# Patient Record
Sex: Female | Born: 1955 | Race: White | Hispanic: No | Smoking: Never smoker
Health system: Southern US, Community
[De-identification: ages and names within clinical notes are randomized; demographics above are authoritative.]

## PROBLEM LIST (undated history)

## (undated) DIAGNOSIS — R51 Headache: Secondary | ICD-10-CM

## (undated) DIAGNOSIS — K5792 Diverticulitis of intestine, part unspecified, without perforation or abscess without bleeding: Secondary | ICD-10-CM

## (undated) DIAGNOSIS — Z8619 Personal history of other infectious and parasitic diseases: Secondary | ICD-10-CM

## (undated) DIAGNOSIS — F329 Major depressive disorder, single episode, unspecified: Secondary | ICD-10-CM

## (undated) DIAGNOSIS — F32A Depression, unspecified: Secondary | ICD-10-CM

## (undated) DIAGNOSIS — E039 Hypothyroidism, unspecified: Secondary | ICD-10-CM

## (undated) DIAGNOSIS — J302 Other seasonal allergic rhinitis: Secondary | ICD-10-CM

## (undated) DIAGNOSIS — Z8669 Personal history of other diseases of the nervous system and sense organs: Secondary | ICD-10-CM

## (undated) DIAGNOSIS — R519 Headache, unspecified: Secondary | ICD-10-CM

## (undated) DIAGNOSIS — M1990 Unspecified osteoarthritis, unspecified site: Secondary | ICD-10-CM

## (undated) DIAGNOSIS — D649 Anemia, unspecified: Secondary | ICD-10-CM

## (undated) HISTORY — DX: Diverticulitis of intestine, part unspecified, without perforation or abscess without bleeding: K57.92

## (undated) HISTORY — DX: Headache: R51

## (undated) HISTORY — PX: KNEE ARTHROSCOPY: SHX127

## (undated) HISTORY — DX: Anemia, unspecified: D64.9

## (undated) HISTORY — DX: Major depressive disorder, single episode, unspecified: F32.9

## (undated) HISTORY — DX: Headache, unspecified: R51.9

## (undated) HISTORY — DX: Personal history of other infectious and parasitic diseases: Z86.19

## (undated) HISTORY — DX: Personal history of other diseases of the nervous system and sense organs: Z86.69

## (undated) HISTORY — DX: Other seasonal allergic rhinitis: J30.2

## (undated) HISTORY — DX: Hypothyroidism, unspecified: E03.9

## (undated) HISTORY — DX: Unspecified osteoarthritis, unspecified site: M19.90

## (undated) HISTORY — DX: Depression, unspecified: F32.A

---

## 2007-03-05 HISTORY — PX: SHOULDER ARTHROSCOPY: SHX128

## 2010-02-23 ENCOUNTER — Other Ambulatory Visit
Admission: RE | Admit: 2010-02-23 | Discharge: 2010-02-23 | Payer: Self-pay | Source: Home / Self Care | Admitting: Family Medicine

## 2010-03-02 ENCOUNTER — Ambulatory Visit (HOSPITAL_COMMUNITY)
Admission: RE | Admit: 2010-03-02 | Discharge: 2010-03-02 | Payer: Self-pay | Source: Home / Self Care | Attending: Family Medicine | Admitting: Family Medicine

## 2010-06-04 ENCOUNTER — Other Ambulatory Visit: Payer: Self-pay | Admitting: Rheumatology

## 2010-06-06 ENCOUNTER — Other Ambulatory Visit: Payer: Self-pay | Admitting: Rheumatology

## 2010-06-06 DIAGNOSIS — M25559 Pain in unspecified hip: Secondary | ICD-10-CM

## 2010-06-08 ENCOUNTER — Ambulatory Visit
Admission: RE | Admit: 2010-06-08 | Discharge: 2010-06-08 | Disposition: A | Discharge status: Home / Self Care | Payer: BC Managed Care – PPO | Source: Ambulatory Visit | Attending: Rheumatology | Admitting: Rheumatology

## 2010-06-08 DIAGNOSIS — M25559 Pain in unspecified hip: Secondary | ICD-10-CM

## 2014-08-22 ENCOUNTER — Ambulatory Visit (INDEPENDENT_AMBULATORY_CARE_PROVIDER_SITE_OTHER): Payer: Managed Care, Other (non HMO)

## 2014-08-22 ENCOUNTER — Ambulatory Visit (INDEPENDENT_AMBULATORY_CARE_PROVIDER_SITE_OTHER): Payer: Managed Care, Other (non HMO) | Admitting: Family Medicine

## 2014-08-22 VITALS — BP 135/76 | HR 76 | Temp 99.2°F | Resp 16 | Ht 62.0 in | Wt 149.0 lb

## 2014-08-22 DIAGNOSIS — R1030 Lower abdominal pain, unspecified: Secondary | ICD-10-CM | POA: Diagnosis not present

## 2014-08-22 LAB — POCT CBC
GRANULOCYTE PERCENT: 78.2 % (ref 37–80)
HEMATOCRIT: 39.3 % (ref 37.7–47.9)
Hemoglobin: 12.8 g/dL (ref 12.2–16.2)
Lymph, poc: 1.7 (ref 0.6–3.4)
MCH: 28.5 pg (ref 27–31.2)
MCHC: 32.6 g/dL (ref 31.8–35.4)
MCV: 87.5 fL (ref 80–97)
MID (CBC): 0.7 (ref 0–0.9)
MPV: 7 fL (ref 0–99.8)
POC GRANULOCYTE: 8.7 — AB (ref 2–6.9)
POC LYMPH %: 15.4 % (ref 10–50)
POC MID %: 6.4 %M (ref 0–12)
Platelet Count, POC: 248 10*3/uL (ref 142–424)
RBC: 4.49 M/uL (ref 4.04–5.48)
RDW, POC: 13.2 %
WBC: 11.1 10*3/uL — AB (ref 4.6–10.2)

## 2014-08-22 LAB — POCT URINALYSIS DIPSTICK
BILIRUBIN UA: NEGATIVE
GLUCOSE UA: NEGATIVE
NITRITE UA: NEGATIVE
PH UA: 6
Protein, UA: NEGATIVE
Spec Grav, UA: 1.015
Urobilinogen, UA: 1

## 2014-08-22 LAB — POCT UA - MICROSCOPIC ONLY
CASTS, UR, LPF, POC: NEGATIVE
CRYSTALS, UR, HPF, POC: NEGATIVE
MUCUS UA: NEGATIVE
YEAST UA: NEGATIVE

## 2014-08-22 NOTE — Patient Instructions (Signed)
Drink plenty of fluids  Take some MiraLAX one dose daily for a couple of days until your bowels seem to have gotten cleared out well.  Continue your medications, ciprofloxacin and metronidazole, that you were given a few days ago.  If the pain gets acutely worse at any time return or go to the emergency room. If you're running higher fever, passing any blood, or any other major symptoms please return.

## 2014-08-22 NOTE — Progress Notes (Signed)
Subjective:  Patient ID: Michelle Parsons, female    DOB: [birth year]  Age: 59 y.o. MRN: [id number]  12-year-old lady who is here for her first time. She moved here a couple of months ago from [place]. On Thursday she was at work and developed low abdominal pain. It got Parsons and she went home. She was having fever and chills and temperature of 101.7. Friday morning the discomfort and pain persisted but she is not is febrile. She went to a different urgent care and told the physician that she had had previous diverticulitis. No labs were done and patient was treated with ciprofloxacin and metronidazole. She has not had the high fevers through the weekend, but has continued to have the pain in her low abdomen. It is seem to be worse in the morning when she gets up and first goes to the bathroom. It has been more steady all day today. She realizes in retrospect that she did have some bloating and discomfort earlier in the week last week. She has not had any surgeries on her abdomen ever and has not had any STDs. She is menopausal. She did have a CT scan in the past when they made the initial diagnosis of diverticulitis.  Other medical problems in her past medical history include arthritis, depression, thyroid disease. She has had arthroscopies but no other surgeries. She is gravida 0 para 0.  Family history is essentially unremarkable.  Social history she does not smoke, drinks about 1 drink per week, and does not use drugs.  Generally she is healthy. No headaches or dizziness. No shortness of breath or cough. No cardiovascular issues. No diarrhea. Bowel movements are normal. She is due a colonoscopy, having been about 10 years since her previous one which was normal. GU is otherwise unremarkable. Dermatologic unremarkable. Musculoskeletal has had shoulder and knee surgeries with arthroscopy. Objective:   Healthy-appearing lady in no major acute distress. Chest clear. Heart regular without murmurs.  Abdomen has normal active bowel sounds. Soft without organomegaly or masses. She is tender diffusely across the lower abdomen from the left lower quadrant all the way across to the right lower quadrant. She has mild tenderness in the upper upper abdomen. No rebound. Skin normal. Afebrile.  Assessment & Plan:   Assessment: Low abdominal pain History of diverticulitis Currently being treated for presumed diverticulitis  Plan: CBC, urinalysis, abdominal series Results for orders placed or performed in visit on 08/22/14  POCT CBC  Result Value Ref Range   WBC 11.1 (A) 4.6 - 10.2 K/uL   Lymph, poc 1.7 0.6 - 3.4   POC LYMPH PERCENT 15.4 10 - 50 %L   MID (cbc) 0.7 0 - 0.9   POC MID % 6.4 0 - 12 %M   POC Granulocyte 8.7 (A) 2 - 6.9   Granulocyte percent 78.2 37 - 80 %G   RBC 4.49 4.04 - 5.48 M/uL   Hemoglobin 12.8 12.2 - 16.2 g/dL   HCT, POC 78.2 95.6 - 47.9 %   MCV 87.5 80 - 97 fL   MCH, POC 28.5 27 - 31.2 pg   MCHC 32.6 31.8 - 35.4 g/dL   RDW, POC 21.3 %   Platelet Count, POC 248 142 - 424 K/uL   MPV 7.0 0 - 99.8 fL  POCT UA - Microscopic Only  Result Value Ref Range   WBC, Ur, HPF, POC 1-4    RBC, urine, microscopic 5-8    Bacteria, U Microscopic trace    Mucus, UA  neg    Epithelial cells, urine per micros 1-2    Crystals, Ur, HPF, POC neg    Casts, Ur, LPF, POC neg    Yeast, UA neg   POCT urinalysis dipstick  Result Value Ref Range   Color, UA yellow    Clarity, UA clear    Glucose, UA neg    Bilirubin, UA neg    Ketones, UA trace    Spec Grav, UA 1.015    Blood, UA mod    pH, UA 6.0    Protein, UA neg    Urobilinogen, UA 1.0    Nitrite, UA neg    Leukocytes, UA Trace (A) Negative   [facility] reading (PRIMARY) by  Dr. Alwyn Ren Moderate amount of gas in the colon, no air-fluid levels, free air, no apparent calculi. Chest is normal. This appears to be a nonspecific abdominal series..   There are no Patient Instructions on file for this visit.   HOPPER,DAVID, MD  08/22/2014

## 2014-11-18 ENCOUNTER — Encounter: Payer: Self-pay | Admitting: Gastroenterology

## 2014-12-09 ENCOUNTER — Ambulatory Visit (AMBULATORY_SURGERY_CENTER): Payer: Managed Care, Other (non HMO) | Admitting: *Deleted

## 2014-12-09 ENCOUNTER — Telehealth: Payer: Self-pay | Admitting: *Deleted

## 2014-12-09 VITALS — Ht 62.0 in | Wt 151.0 lb

## 2014-12-09 DIAGNOSIS — Z1211 Encounter for screening for malignant neoplasm of colon: Secondary | ICD-10-CM

## 2014-12-09 NOTE — Progress Notes (Signed)
No egg or soy allergy No diet pill No home 02  No issues with past sedation emmi video declined Pt had a colon 10 + years ago in [place] which was normal. She had diverticulitis august 2006 and this year has had two episodes 05-2014 and again 08-2014. She now 12-2014 this week ate salad and has had lower abd pain but no fever so far this episode.  She has tried to tweek her diet but continues to get these painful abdominal episodes with fever. Her colon is scheduled for 10-21 Friday at 200 pm.  Pt concerned about re occurance. Her sister has had colon removed due to the diverticulitis. TE to Armbruster asking if he wants OV.  Records release signed and to Armbruster's CMA Amber  to get old records from [facility] . Last colon 2006 per pt, was normal per pt.

## 2014-12-09 NOTE — Telephone Encounter (Signed)
Pt had a colon 10 + years ago in [place] which was normal. She had diverticulitis august 2006 and this year has had two episodes 05-2014 and again 08-2014. She now 12-2014 this week ate salad and has had lower abd pain but no fever so far this episode.  She has tried to tweek her diet but continues to get these painful abdominal episodes with fever. Her colon is scheduled for 10-21 Friday at 200 pm.  Pt concerned about re occurance. Her sister has had colon removed due to the diverticulitis  Do you want her to have an OV prior to this colon or just discuss with her post colonoscopy on the 21st?  Please advise  Thanks, Hilda Lias PV

## 2014-12-09 NOTE — Telephone Encounter (Signed)
If she is having fevers or active pain consistent with her prior diverticulitis symptoms then we should hold off on colonoscopy and allow her to get through the episode, and see her in clinic to get her treated. If symptoms have resolved and she is feeling well then we can proceed with the procedure as scheduled.

## 2014-12-12 NOTE — Telephone Encounter (Signed)
Called pt concerning symptoms noted during PV. Pt said she has not had any pain since Friday, doesn't have a fever, and would like to proceed with the procedure on 12-23-14. Advised per Dr. Lanetta Inch note, okay to proceed. Also advised pt to call us if symptoms reoccur.

## 2014-12-23 ENCOUNTER — Ambulatory Visit (AMBULATORY_SURGERY_CENTER): Payer: Managed Care, Other (non HMO) | Admitting: Gastroenterology

## 2014-12-23 ENCOUNTER — Encounter: Payer: Self-pay | Admitting: Gastroenterology

## 2014-12-23 ENCOUNTER — Telehealth: Payer: Self-pay | Admitting: Gastroenterology

## 2014-12-23 VITALS — BP 111/67 | HR 60 | Temp 98.5°F | Resp 14 | Ht 62.0 in | Wt 151.0 lb

## 2014-12-23 DIAGNOSIS — Z1211 Encounter for screening for malignant neoplasm of colon: Secondary | ICD-10-CM | POA: Diagnosis not present

## 2014-12-23 MED ORDER — SODIUM CHLORIDE 0.9 % IV SOLN: 500.0000 mL | INTRAVENOUS | Status: DC

## 2014-12-23 NOTE — Telephone Encounter (Signed)
Spoke with patient and she stated that she couldn't drink the prep last night without throwing it up. She is having stools, so I told her to start the prep now.  Take a little longer sipping it and to use a straw.  She will also sip on coke in the meantime to settle her stomach.  Patient will call us if she has any more problems.

## 2014-12-23 NOTE — Op Note (Signed)
[facility] [address] [place], [id number]   COLONOSCOPY PROCEDURE REPORT  PATIENT: Michelle Parsons, Michelle Parsons  MR#: [id number] BIRTHDATE: [birth year] , 59  yrs. old GENDER: female ENDOSCOPIST: Benancio DeedsSteven P Armbruster, MD REFERRED BY: Hillard DankerElizabeth Crawford, MD PROCEDURE DATE:  12/23/2014 PROCEDURE:   Colonoscopy, screening First Screening Colonoscopy - Avg.  risk and is 50 yrs.  old or older - No.  Prior Negative Screening - Now for repeat screening. 10 or more years since last screening  History of Adenoma - Now for follow-up colonoscopy & has been > or = to 3 yrs.  N/A  Polyps removed today? No Recommend repeat exam, <10 yrs? No ASA CLASS:   Class II INDICATIONS:Screening for colonic neoplasia and Colorectal Neoplasm Risk Assessment for this procedure is average risk. MEDICATIONS: Propofol 300 mg IV  DESCRIPTION OF PROCEDURE:   After the risks benefits and alternatives of the procedure were thoroughly explained, informed consent was obtained.  The digital rectal exam revealed no abnormalities of the rectum.   The LB [id number]-[id number]  endoscope was introduced through the anus and advanced to the cecum, which was identified by both the appendix and ileocecal valve. No adverse events experienced.   The quality of the prep was good.  The instrument was then slowly withdrawn as the colon was fully examined. Estimated blood loss is zero unless otherwise noted in this procedure report.   COLON FINDINGS: There was moderate diverticulosis noted throughout the entire examined colon, heaviest burden in the right colon. The examination was otherwise normal. No polyps or mass lesions appreciated.  Retroflexed views revealed no abnormalities. The time to cecum = 4.8 Withdrawal time = 16.8   The scope was withdrawn and the procedure completed. COMPLICATIONS: There were no immediate complications.  ENDOSCOPIC IMPRESSION: 1.   Moderate diverticulosis was noted throughout the  entire examined colon 2.   The examination was otherwise normal  RECOMMENDATIONS: Repeat colonoscopy screening in 10 years Resume diet Resume medications High fiber diet for diverticulosis.  Consider surgical evaluation if continued recurrence of diverticulitis  eSigned:  Benancio DeedsSteven P Armbruster, MD 12/23/2014 2:33 PM   cc: Hillard DankerElizabeth Crawford MD, the patient

## 2014-12-23 NOTE — Progress Notes (Signed)
To recovery, [place] to [place], Charity fundraiser[place], VSS

## 2014-12-23 NOTE — Patient Instructions (Signed)
YOU HAD AN ENDOSCOPIC PROCEDURE TODAY AT THE [facility]:   Refer to the procedure report that was given to you for any specific questions about what was found during the examination.  If the procedure report does not answer your questions, please call your gastroenterologist to clarify.  If you requested that your care partner not be given the details of your procedure findings, then the procedure report has been included in a sealed envelope for you to review at your convenience later.  YOU SHOULD EXPECT: Some feelings of bloating in the abdomen. Passage of more gas than usual.  Walking can help get rid of the air that was put into your GI tract during the procedure and reduce the bloating. If you had a lower endoscopy (such as a colonoscopy or flexible sigmoidoscopy) you may notice spotting of blood in your stool or on the toilet paper. If you underwent a bowel prep for your procedure, you may not have a normal bowel movement for a few days.  Please Note:  You might notice some irritation and congestion in your nose or some drainage.  This is from the oxygen used during your procedure.  There is no need for concern and it should clear up in a day or so.  SYMPTOMS TO REPORT IMMEDIATELY:   Following lower endoscopy (colonoscopy or flexible sigmoidoscopy):  Excessive amounts of blood in the stool  Significant tenderness or worsening of abdominal pains  Swelling of the abdomen that is new, acute  Fever of 100F or higher  For urgent or emergent issues, a gastroenterologist can be reached at any hour by calling [phone].   DIET: Your first meal following the procedure should be a small meal and then it is ok to progress to your normal diet. Heavy or fried foods are harder to digest and may make you feel nauseous or bloated.  Likewise, meals heavy in dairy and vegetables can increase bloating.  Drink plenty of fluids but you should avoid alcoholic beverages for 24  hours.  ACTIVITY:  You should plan to take it easy for the rest of today and you should NOT DRIVE or use heavy machinery until tomorrow (because of the sedation medicines used during the test).    FOLLOW UP: Our staff will call the number listed on your records the next business day following your procedure to check on you and address any questions or concerns that you may have regarding the information given to you following your procedure. If we do not reach you, we will leave a message.  However, if you are feeling well and you are not experiencing any problems, there is no need to return our call.  We will assume that you have returned to your regular daily activities without incident.  If any biopsies were taken you will be contacted by phone or by letter within the next 1-3 weeks.  Please call us at [phone] if you have not heard about the biopsies in 3 weeks.    SIGNATURES/CONFIDENTIALITY: You and/or your care partner have signed paperwork which will be entered into your electronic medical record.  These signatures attest to the fact that that the information above on your After Visit Summary has been reviewed and is understood.  Full responsibility of the confidentiality of this discharge information lies with you and/or your care-partner.  Diverticulosis, high fiber diet-handouts given  Repeat colonoscopy in 10 years 2026.  

## 2014-12-26 ENCOUNTER — Telehealth: Payer: Self-pay

## 2014-12-26 NOTE — Telephone Encounter (Signed)
No answer, left voicemail message.

## 2015-01-12 ENCOUNTER — Ambulatory Visit (INDEPENDENT_AMBULATORY_CARE_PROVIDER_SITE_OTHER): Payer: Managed Care, Other (non HMO) | Admitting: Internal Medicine

## 2015-01-12 ENCOUNTER — Other Ambulatory Visit (INDEPENDENT_AMBULATORY_CARE_PROVIDER_SITE_OTHER): Payer: Managed Care, Other (non HMO)

## 2015-01-12 ENCOUNTER — Encounter: Payer: Self-pay | Admitting: Internal Medicine

## 2015-01-12 VITALS — BP 122/64 | HR 81 | Temp 98.2°F | Resp 14 | Ht 62.0 in | Wt 153.8 lb

## 2015-01-12 DIAGNOSIS — E039 Hypothyroidism, unspecified: Secondary | ICD-10-CM

## 2015-01-12 DIAGNOSIS — Z0000 Encounter for general adult medical examination without abnormal findings: Secondary | ICD-10-CM | POA: Diagnosis not present

## 2015-01-12 DIAGNOSIS — K5732 Diverticulitis of large intestine without perforation or abscess without bleeding: Secondary | ICD-10-CM

## 2015-01-12 DIAGNOSIS — R102 Pelvic and perineal pain: Secondary | ICD-10-CM

## 2015-01-12 LAB — LIPID PANEL
CHOLESTEROL: 259 mg/dL — AB (ref 0–200)
HDL: 59 mg/dL (ref >39.00)
LDL CALC: 169 mg/dL — AB (ref 0–99)
NONHDL: 200.24
Total CHOL/HDL Ratio: 4
Triglycerides: 156 mg/dL — ABNORMAL HIGH (ref 0.0–149.0)
VLDL: 31.2 mg/dL (ref 0.0–40.0)

## 2015-01-12 LAB — CBC
HEMATOCRIT: 41.7 % (ref 36.0–46.0)
Hemoglobin: 14 g/dL (ref 12.0–15.0)
MCHC: 33.6 g/dL (ref 30.0–36.0)
MCV: 87.3 fl (ref 78.0–100.0)
Platelets: 219 10*3/uL (ref 150.0–400.0)
RBC: 4.78 Mil/uL (ref 3.87–5.11)
RDW: 13 % (ref 11.5–15.5)
WBC: 12.3 10*3/uL — AB (ref 4.0–10.5)

## 2015-01-12 LAB — COMPREHENSIVE METABOLIC PANEL
ALBUMIN: 4.4 g/dL (ref 3.5–5.2)
ALT: 15 U/L (ref 0–35)
AST: 17 U/L (ref 0–37)
Alkaline Phosphatase: 76 U/L (ref 39–117)
BUN: 16 mg/dL (ref 6–23)
CHLORIDE: 102 meq/L (ref 96–112)
CO2: 28 mEq/L (ref 19–32)
CREATININE: 0.98 mg/dL (ref 0.40–1.20)
Calcium: 10 mg/dL (ref 8.4–10.5)
GFR: 61.43 mL/min (ref >60.00)
Glucose, Bld: 128 mg/dL — ABNORMAL HIGH (ref 70–99)
Potassium: 3.9 mEq/L (ref 3.5–5.1)
SODIUM: 138 meq/L (ref 135–145)
TOTAL PROTEIN: 7.4 g/dL (ref 6.0–8.3)
Total Bilirubin: 0.5 mg/dL (ref 0.2–1.2)

## 2015-01-12 LAB — HEMOGLOBIN A1C: HEMOGLOBIN A1C: 5.4 % (ref 4.6–6.5)

## 2015-01-12 LAB — T4, FREE: FREE T4: 0.74 ng/dL (ref 0.60–1.60)

## 2015-01-12 LAB — TSH: TSH: 1.46 u[IU]/mL (ref 0.35–4.50)

## 2015-01-12 NOTE — Progress Notes (Signed)
Pre visit review using our clinic review tool, if applicable. No additional management support is needed unless otherwise documented below in the visit note. 

## 2015-01-12 NOTE — Patient Instructions (Signed)
We are checking the blood work today and will call you with the results.   We are also getting a pelvic ultrasound which will look at the ovaries and the uterus.   Keep working on adding more fiber to your diet and getting more exercise.   High-Fiber Diet Fiber, also called dietary fiber, is a type of carbohydrate found in fruits, vegetables, whole grains, and beans. A high-fiber diet can have many health benefits. Your health care provider may recommend a high-fiber diet to help:  Prevent constipation. Fiber can make your bowel movements more regular.  Lower your cholesterol.  Relieve hemorrhoids, uncomplicated diverticulosis, or irritable bowel syndrome.  Prevent overeating as part of a weight-loss plan.  Prevent heart disease, type 2 diabetes, and certain cancers. WHAT IS MY PLAN? The recommended daily intake of fiber includes:  38 grams for men under age 25.  30 grams for men over age 78.  25 grams for women under age 71.  21 grams for women over age 25. You can get the recommended daily intake of dietary fiber by eating a variety of fruits, vegetables, grains, and beans. Your health care provider may also recommend a fiber supplement if it is not possible to get enough fiber through your diet. WHAT DO I NEED TO KNOW ABOUT A HIGH-FIBER DIET?  Fiber supplements have not been widely studied for their effectiveness, so it is better to get fiber through food sources.  Always check the fiber content on thenutrition facts label of any prepackaged food. Look for foods that contain at least 5 grams of fiber per serving.  Ask your dietitian if you have questions about specific foods that are related to your condition, especially if those foods are not listed in the following section.  Increase your daily fiber consumption gradually. Increasing your intake of dietary fiber too quickly may cause bloating, cramping, or gas.  Drink plenty of water. Water helps you to digest  fiber. WHAT FOODS CAN I EAT? Grains Whole-grain breads. Multigrain cereal. Oats and oatmeal. Brown rice. Barley. Bulgur wheat. Millet. Bran muffins. Popcorn. Rye wafer crackers. Vegetables Sweet potatoes. Spinach. Kale. Artichokes. Cabbage. Broccoli. Green peas. Carrots. Squash. Fruits Berries. Pears. Apples. Oranges. Avocados. Prunes and raisins. Dried figs. Meats and Other Protein Sources Navy, kidney, pinto, and soy beans. Split peas. Lentils. Nuts and seeds. Dairy Fiber-fortified yogurt. Beverages Fiber-fortified soy milk. Fiber-fortified orange juice. Other Fiber bars. The items listed above may not be a complete list of recommended foods or beverages. Contact your dietitian for more options. WHAT FOODS ARE NOT RECOMMENDED? Grains White bread. Pasta made with refined flour. White rice. Vegetables Fried potatoes. Canned vegetables. Well-cooked vegetables.  Fruits Fruit juice. Cooked, strained fruit. Meats and Other Protein Sources Fatty cuts of meat. Fried Environmental education officer or fried fish. Dairy Milk. Yogurt. Cream cheese. Sour cream. Beverages Soft drinks. Other Cakes and pastries. Butter and oils. The items listed above may not be a complete list of foods and beverages to avoid. Contact your dietitian for more information. WHAT ARE SOME TIPS FOR INCLUDING HIGH-FIBER FOODS IN MY DIET?  Eat a wide variety of high-fiber foods.  Make sure that half of all grains consumed each day are whole grains.  Replace breads and cereals made from refined flour or white flour with whole-grain breads and cereals.  Replace white rice with brown rice, bulgur wheat, or millet.  Start the day with a breakfast that is high in fiber, such as a cereal that contains at least 5 grams of  fiber per serving.  Use beans in place of meat in soups, salads, or pasta.  Eat high-fiber snacks, such as berries, raw vegetables, nuts, or popcorn.   This information is not intended to replace advice given to you  by your health care provider. Make sure you discuss any questions you have with your health care provider.   Document Released: 02/18/2005 Document Revised: 03/11/2014 Document Reviewed: 08/03/2013 Elsevier Interactive Patient Education [employer] Elsevier Inc.

## 2015-01-13 ENCOUNTER — Encounter: Payer: Self-pay | Admitting: Internal Medicine

## 2015-01-13 ENCOUNTER — Telehealth: Payer: Self-pay | Admitting: Gastroenterology

## 2015-01-13 DIAGNOSIS — R102 Pelvic and perineal pain: Secondary | ICD-10-CM | POA: Insufficient documentation

## 2015-01-13 DIAGNOSIS — K5732 Diverticulitis of large intestine without perforation or abscess without bleeding: Secondary | ICD-10-CM | POA: Insufficient documentation

## 2015-01-13 LAB — HEPATITIS C ANTIBODY: HCV AB: NEGATIVE

## 2015-01-13 NOTE — Assessment & Plan Note (Signed)
Recent colonoscopy without inflammation but clear diverticula. Advised her to increase fiber in her diet. No other dietary restrictions. Given the recent colonoscopy advised if she is still having the pain to try probiotic.

## 2015-01-13 NOTE — Telephone Encounter (Signed)
Spoke with patient and she states she had lower abdominal pain last night. States she had a colonoscopy a few weeks ago and Dr. Adela LankArmbruster told her to call if she had abdominal pain to call to have a CT scan. Denies constipation, diarrhea, nausea, vomiting or fever. Please, advise.

## 2015-01-13 NOTE — Progress Notes (Signed)
   Subjective:    Patient ID: Michelle Parsons, female    DOB: [birth year], 59 y.o.   MRN: [id number]  HPI The patient is a 59 YO female coming in new for follow up of her diverticulitis. She has had an episode in June which was treated with antibiotics. She took them all and recovered. She got colonoscopy recently which was normal but had diverticula. No fevers or chills. Denies constipation or nausea or diarrhea. Is having some mild low stomach pain and is not sure if it is related to her ovaries. She denies any vaginal bleeding or discharge. Does not have an ob/gyn. She feels like she might have a small amount of abdominal swelling.   [facility], [facility] reviewed and updated.   Review of Systems  Constitutional: Positive for appetite change. Negative for fever, activity change, fatigue and unexpected weight change.       Trying to eat better.   HENT: Negative.   Eyes: Negative.   Respiratory: Negative for cough, chest tightness, shortness of breath and wheezing.   Cardiovascular: Negative for chest pain, palpitations and leg swelling.  Gastrointestinal: Positive for abdominal pain. Negative for nausea, vomiting, diarrhea, constipation, blood in stool and abdominal distention.  Musculoskeletal: Negative.   Skin: Negative.   Neurological: Negative.   Psychiatric/Behavioral: Negative.       Objective:   Physical Exam  Constitutional: She is oriented to person, place, and time. She appears well-developed and well-nourished.  HENT:  Head: Normocephalic and atraumatic.  Eyes: EOM are normal.  Neck: Normal range of motion.  Cardiovascular: Normal rate and regular rhythm.   Pulmonary/Chest: Effort normal and breath sounds normal. No respiratory distress. She has no wheezes. She has no rales.  Abdominal: Soft. She exhibits no mass. There is no rebound and no guarding.  Minimal distention versus adiposity and mild tenderness in the low pelvic area (right and left)  Musculoskeletal: She  exhibits no edema.  Neurological: She is alert and oriented to person, place, and time. Coordination normal.  Skin: Skin is warm and dry.  Psychiatric: She has a normal mood and affect.   Filed Vitals:   01/12/15 1411  BP: 122/64  Pulse: 81  Temp: 98.2 F (36.8 C)  TempSrc: Oral  Resp: 14  Height: 5\' 2"  (1.575 m)  Weight: 153 lb 12.8 oz (69.763 kg)  SpO2: 98%      Assessment & Plan:

## 2015-01-13 NOTE — Assessment & Plan Note (Signed)
No weight change and mild distention on exam. Getting pelvic ultrasound to look for ovarian cysts or other abnormality. Not clearly related to her recent diverticulitis although it is possible after colonoscopy. Advised her to start more fiber in her diet. Checking labs.

## 2015-01-13 NOTE — Telephone Encounter (Signed)
I believe this patient is referring to her history of recurrent diverticulitis. She had a colonoscopy showing diverticulosis throughout the entire colon. I had recommended if she has recurrence of diverticulitis to have a CT scan done to assess where in the colon she is having it, in case she needed a surgery to prevent recurrent in the future. If she is no longer in pain I don't think she needs it. If she is having symptoms of her typical diverticulitis with fever, etc, then would recommend a CT scan when she is in pain. We can try to coordinate it as outpatient however if she is in pain the fastest way to do it would be through the ER. If her pain has resolved I doubt it was diverticulitis and would hold off on imaging if she feels okay today. Can you explain this to her? Thanks

## 2015-01-13 NOTE — Telephone Encounter (Signed)
Spoke with patient and she is having pain now similar to diverticulitis pain but no fever. She has decided to go to ED to be checked out.

## 2015-01-16 ENCOUNTER — Other Ambulatory Visit: Payer: Self-pay | Admitting: Internal Medicine

## 2015-01-16 DIAGNOSIS — R102 Pelvic and perineal pain: Secondary | ICD-10-CM

## 2015-01-18 ENCOUNTER — Ambulatory Visit
Admission: RE | Admit: 2015-01-18 | Discharge: 2015-01-18 | Disposition: A | Discharge status: Home / Self Care | Payer: Managed Care, Other (non HMO) | Source: Ambulatory Visit | Attending: Internal Medicine | Admitting: Internal Medicine

## 2015-01-18 DIAGNOSIS — R102 Pelvic and perineal pain: Secondary | ICD-10-CM

## 2015-02-20 ENCOUNTER — Other Ambulatory Visit: Payer: Self-pay

## 2015-02-20 DIAGNOSIS — Z1231 Encounter for screening mammogram for malignant neoplasm of breast: Secondary | ICD-10-CM

## 2015-03-09 ENCOUNTER — Ambulatory Visit
Admission: RE | Admit: 2015-03-09 | Discharge: 2015-03-09 | Disposition: A | Discharge status: Home / Self Care | Payer: Managed Care, Other (non HMO) | Source: Ambulatory Visit

## 2015-03-09 DIAGNOSIS — Z1231 Encounter for screening mammogram for malignant neoplasm of breast: Secondary | ICD-10-CM

## 2015-03-21 ENCOUNTER — Encounter (HOSPITAL_COMMUNITY): Payer: Self-pay | Admitting: Emergency Medicine

## 2015-03-21 ENCOUNTER — Emergency Department (HOSPITAL_COMMUNITY)
Admission: EM | Admit: 2015-03-21 | Discharge: 2015-03-21 | Discharge status: Against Medical Advice | Payer: Managed Care, Other (non HMO) | Attending: Emergency Medicine | Admitting: Emergency Medicine

## 2015-03-21 DIAGNOSIS — R1031 Right lower quadrant pain: Secondary | ICD-10-CM | POA: Diagnosis not present

## 2015-03-21 LAB — CBC
HCT: 39.2 % (ref 36.0–46.0)
Hemoglobin: 12.9 g/dL (ref 12.0–15.0)
MCH: 29.4 pg (ref 26.0–34.0)
MCHC: 32.9 g/dL (ref 30.0–36.0)
MCV: 89.3 fL (ref 78.0–100.0)
Platelets: 223 10*3/uL (ref 150–400)
RBC: 4.39 MIL/uL (ref 3.87–5.11)
RDW: 13 % (ref 11.5–15.5)
WBC: 12.6 10*3/uL — ABNORMAL HIGH (ref 4.0–10.5)

## 2015-03-21 LAB — URINALYSIS, ROUTINE W REFLEX MICROSCOPIC
Glucose, UA: NEGATIVE mg/dL
KETONES UR: 15 mg/dL — AB
NITRITE: NEGATIVE
PH: 5 (ref 5.0–8.0)
Protein, ur: 30 mg/dL — AB
Specific Gravity, Urine: 1.026 (ref 1.005–1.030)

## 2015-03-21 LAB — COMPREHENSIVE METABOLIC PANEL
ALBUMIN: 4.1 g/dL (ref 3.5–5.0)
ALK PHOS: 70 U/L (ref 38–126)
ALT: 12 U/L — AB (ref 14–54)
AST: 14 U/L — AB (ref 15–41)
Anion gap: 10 (ref 5–15)
BILIRUBIN TOTAL: 0.7 mg/dL (ref 0.3–1.2)
BUN: 14 mg/dL (ref 6–20)
CALCIUM: 9.8 mg/dL (ref 8.9–10.3)
CO2: 26 mmol/L (ref 22–32)
Chloride: 103 mmol/L (ref 101–111)
Creatinine, Ser: 0.79 mg/dL (ref 0.44–1.00)
GFR calc Af Amer: >60 mL/min (ref >60)
GFR calc non Af Amer: >60 mL/min (ref >60)
GLUCOSE: 110 mg/dL — AB (ref 65–99)
Potassium: 4.1 mmol/L (ref 3.5–5.1)
Sodium: 139 mmol/L (ref 135–145)
TOTAL PROTEIN: 7.7 g/dL (ref 6.5–8.1)

## 2015-03-21 LAB — URINE MICROSCOPIC-ADD ON

## 2015-03-21 LAB — LIPASE, BLOOD: Lipase: 26 U/L (ref 11–51)

## 2015-03-21 NOTE — ED Notes (Signed)
Pt decided to leave and said she would return tomorrow.

## 2015-03-21 NOTE — ED Notes (Addendum)
Pt reports lower abd pain since Saturday. Worse on R side. Hx of diverticulitis this summer. Pain worse with movement

## 2015-03-27 ENCOUNTER — Ambulatory Visit (INDEPENDENT_AMBULATORY_CARE_PROVIDER_SITE_OTHER): Payer: Managed Care, Other (non HMO) | Admitting: Family Medicine

## 2015-03-27 VITALS — BP 128/70 | HR 67 | Temp 98.0°F | Resp 16 | Ht 62.0 in | Wt 155.2 lb

## 2015-03-27 DIAGNOSIS — K5730 Diverticulosis of large intestine without perforation or abscess without bleeding: Secondary | ICD-10-CM | POA: Diagnosis not present

## 2015-03-27 DIAGNOSIS — R109 Unspecified abdominal pain: Secondary | ICD-10-CM

## 2015-03-27 MED ORDER — METRONIDAZOLE 500 MG PO TABS: 500.0000 mg | ORAL_TABLET | Freq: Two times a day (BID) | ORAL | Status: AC

## 2015-03-27 MED ORDER — CIPROFLOXACIN HCL 500 MG PO TABS: 500.0000 mg | ORAL_TABLET | Freq: Two times a day (BID) | ORAL | Status: AC

## 2015-03-27 NOTE — Progress Notes (Signed)
Subjective:  This chart was scribed for Michelle Sidle MD, by Michelle Parsons, at [facility].  This patient was seen in room 8 and the patient's care was started at 12:14 PM.   Chief Complaint  Patient presents with   Sore Throat    x 2 days     Patient ID: Michelle Parsons, female    DOB: 1955/07/14, 60 y.o.   MRN: [id number]  Sore Throat  Associated symptoms include abdominal pain. Pertinent negatives include no coughing, neck pain, shortness of breath or vomiting.    HPI Comments: Michelle Parsons is a 60 y.o. female who presents to the [facility] with a history of diverticulosis and is complaining of left sided abdominal pain .  She had abdominal pain last week and states that she had a fever along with it.  She no longer has the fever.  She has pain when she goes to urinate and feels like everything is "falling down". Patient states that she usually "toughs it out" when she has these symptoms but feels like it is lasting a very long time this time around.  She went to the emergency room 6 days ago due to her pain and states that she was never able to see a physician or get a scan of her abdomen but did have blood drawn for tests.   Her last bowel movement was 4 days ago and has been taking Citracal as well as fiber.  Patient is up to date with her colonoscopy.   She is also complaining of a swelling in her throat onset two days ago.    Her last CT scan was in March 2017.   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      Patient Active Problem List   Diagnosis Date Noted   Pelvic pain in female 01/13/2015   Diverticulitis of colon 01/13/2015   Past Medical History  Diagnosis Date   Arthritis    Hypothyroidism    Diverticulitis    Frequent headaches    History of chicken pox    History of migraine    Seasonal allergies    Depression    Anemia    Past Surgical History  Procedure  Laterality Date   Knee arthroscopy      x4   Shoulder arthroscopy  2009   Allergies  Allergen Reactions   Nickel     Allergy tested positive but unknown reaction   Amoxicillin Rash    Has patient had a PCN reaction causing immediate rash, facial/tongue/throat swelling, SOB or lightheadedness with hypotension: unknown Has patient had a PCN reaction causing severe rash involving mucus membranes or skin necrosis: unkown Has patient had a PCN reaction that required hospitalization No Has patient had a PCN reaction occurring within the last 10 years: No If all of the above answers are "NO", then may proceed with Cephalosporin use.    Erythromycin Rash   Tramadol Rash   Prior to Admission medications   Medication Sig Start Date End Date Taking? Authorizing Provider  Cinnamon Oil OIL 1 application by Does not apply route daily as needed (abdominal pain). Cinnamon oil compounded with coconut oil   Yes Historical Provider, MD  OVER THE COUNTER MEDICATION Take 10 mLs by mouth daily as needed (abdominal pain). Holistic over the counter abdominal pain syrup   Yes Historical Provider, MD  OVER THE COUNTER MEDICATION Take 3 capsules by mouth daily as needed (  immune support). Holistic over the counter immune support   Yes Historical Provider, MD  Pseudoephedrine-Naproxen Na (ALEVE-D SINUS & COLD PO) Take 1 tablet by mouth daily as needed (cold symptoms).    Yes Historical Provider, MD   Social History   Social History   Marital Status: Divorced    Spouse Name: N/A   Number of Children: N/A   Years of Education: N/A   Occupational History   Not on file.   Social History Main Topics   Smoking status: Never Smoker    Smokeless tobacco: Never Used   Alcohol Use: 0.0 oz/week    0 Standard drinks or equivalent per week     Comment: occasionally   Drug Use: No   Sexual Activity: Not on file   Other Topics Concern   Not on file   Social History Narrative        Review  of Systems  Constitutional: Negative for fever and chills.  Respiratory: Negative for cough, choking and shortness of breath.   Cardiovascular: Negative for chest pain.  Gastrointestinal: Positive for abdominal pain. Negative for nausea and vomiting.  Musculoskeletal: Negative for neck pain and neck stiffness.       Objective:   Physical Exam  Constitutional: She is oriented to person, place, and time. She appears well-developed.  HENT:  Head: Normocephalic and atraumatic.  Eyes: Pupils are equal, round, and reactive to light.  Pulmonary/Chest: Effort normal. No respiratory distress.  Abdominal: There is tenderness. There is no rebound and no guarding.  Hyperactive bowel sounds Tender in left upper quadrant and right lower quadrant   Neurological: She is alert and oriented to person, place, and time.  Skin: Skin is warm and dry.  Psychiatric: She has a normal mood and affect. Her behavior is normal.   Filed Vitals:   03/27/15 1210  BP: 128/70  Pulse: 67  Temp: 98 F (36.7 C)  TempSrc: Oral  Resp: 16  Height:  (1.575 m)  Weight: 155 lb 3.2 oz (70.398 kg)  SpO2: 96%    Results for orders placed or performed during the hospital encounter of 03/21/15  Lipase, blood  Result Value Ref Range   Lipase 26 11 - 51 U/L  Comprehensive metabolic panel  Result Value Ref Range   Sodium 139 135 - 145 mmol/L   Potassium 4.1 3.5 - 5.1 mmol/L   Chloride 103 101 - 111 mmol/L   CO2 26 22 - 32 mmol/L   Glucose, Bld 110 (H) 65 - 99 mg/dL   BUN 14 6 - 20 mg/dL   Creatinine, Ser 1.61 0.44 - 1.00 mg/dL   Calcium 9.8 8.9 - 09.6 mg/dL   Total Protein 7.7 6.5 - 8.1 g/dL   Albumin 4.1 3.5 - 5.0 g/dL   AST 14 (L) 15 - 41 U/L   ALT 12 (L) 14 - 54 U/L   Alkaline Phosphatase 70 38 - 126 U/L   Total Bilirubin 0.7 0.3 - 1.2 mg/dL   GFR calc non Af Amer >60 >60 mL/min   GFR calc Af Amer >60 >60 mL/min   Anion gap 10 5 - 15  CBC  Result Value Ref Range   WBC 12.6 (H) 4.0 - 10.5 K/uL    RBC 4.39 3.87 - 5.11 MIL/uL   Hemoglobin 12.9 12.0 - 15.0 g/dL   HCT 04.5 40.9 - 81.1 %   MCV 89.3 78.0 - 100.0 fL   MCH 29.4 26.0 - 34.0 pg   MCHC 32.9 30.0 -  36.0 g/dL   RDW 16.1 09.6 - 04.5 %   Platelets 223 150 - 400 K/uL  Urinalysis, Routine w reflex microscopic (not at [facility])  Result Value Ref Range   Color, Urine AMBER (A) YELLOW   APPearance CLOUDY (A) CLEAR   Specific Gravity, Urine 1.026 1.005 - 1.030   pH 5.0 5.0 - 8.0   Glucose, UA NEGATIVE NEGATIVE mg/dL   Hgb urine dipstick LARGE (A) NEGATIVE   Bilirubin Urine SMALL (A) NEGATIVE   Ketones, ur 15 (A) NEGATIVE mg/dL   Protein, ur 30 (A) NEGATIVE mg/dL   Nitrite NEGATIVE NEGATIVE   Leukocytes, UA SMALL (A) NEGATIVE  Urine microscopic-add on  Result Value Ref Range   Squamous Epithelial / LPF 0-5 (A) NONE SEEN   WBC, UA 0-5 0 - 5 WBC/hpf   RBC / HPF 6-30 0 - 5 RBC/hpf   Bacteria, UA RARE (A) NONE SEEN   Urine-Other MUCOUS PRESENT        Assessment & Plan:   This chart was scribed in my presence and reviewed by me personally.    ICD-9-CM ICD-10-CM   1. Abdominal pain, unspecified abdominal location 789.00 R10.9 ciprofloxacin (CIPRO) 500 MG tablet     metroNIDAZOLE (FLAGYL) 500 MG tablet  2. Diverticulosis of colon without hemorrhage 562.10 K57.30 ciprofloxacin (CIPRO) 500 MG tablet     metroNIDAZOLE (FLAGYL) 500 MG tablet     Signed, Michelle Sidle, MD

## 2015-03-27 NOTE — Patient Instructions (Signed)
Please call on Wednesday if you're not doing better. I would stop the Metamucil for now.

## 2016-10-17 IMAGING — CR DG ABDOMEN ACUTE W/ 1V CHEST
3 series · 3 of 3 positions shown · non-contrast
Comparison: No priors.

CLINICAL DATA: 59-year-old female with lower abdominal pain for the
past 4 days, with some associated fever, chills and symptoms
concerning for potential diverticulitis.

EXAM:
DG ABDOMEN ACUTE W/ 1V CHEST

[PA]
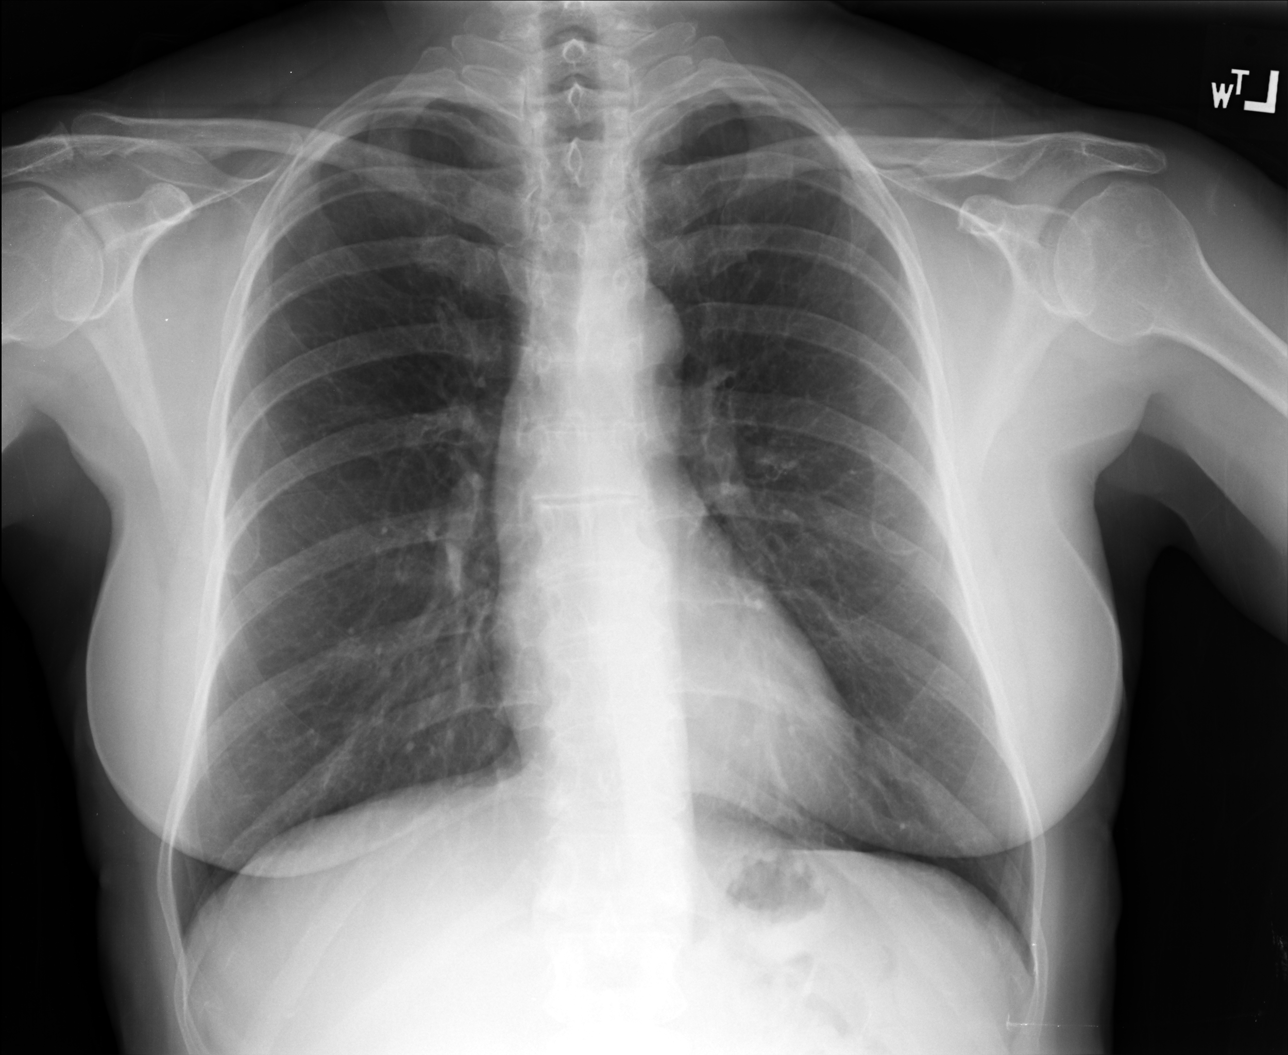

[AP (1 of 2)]
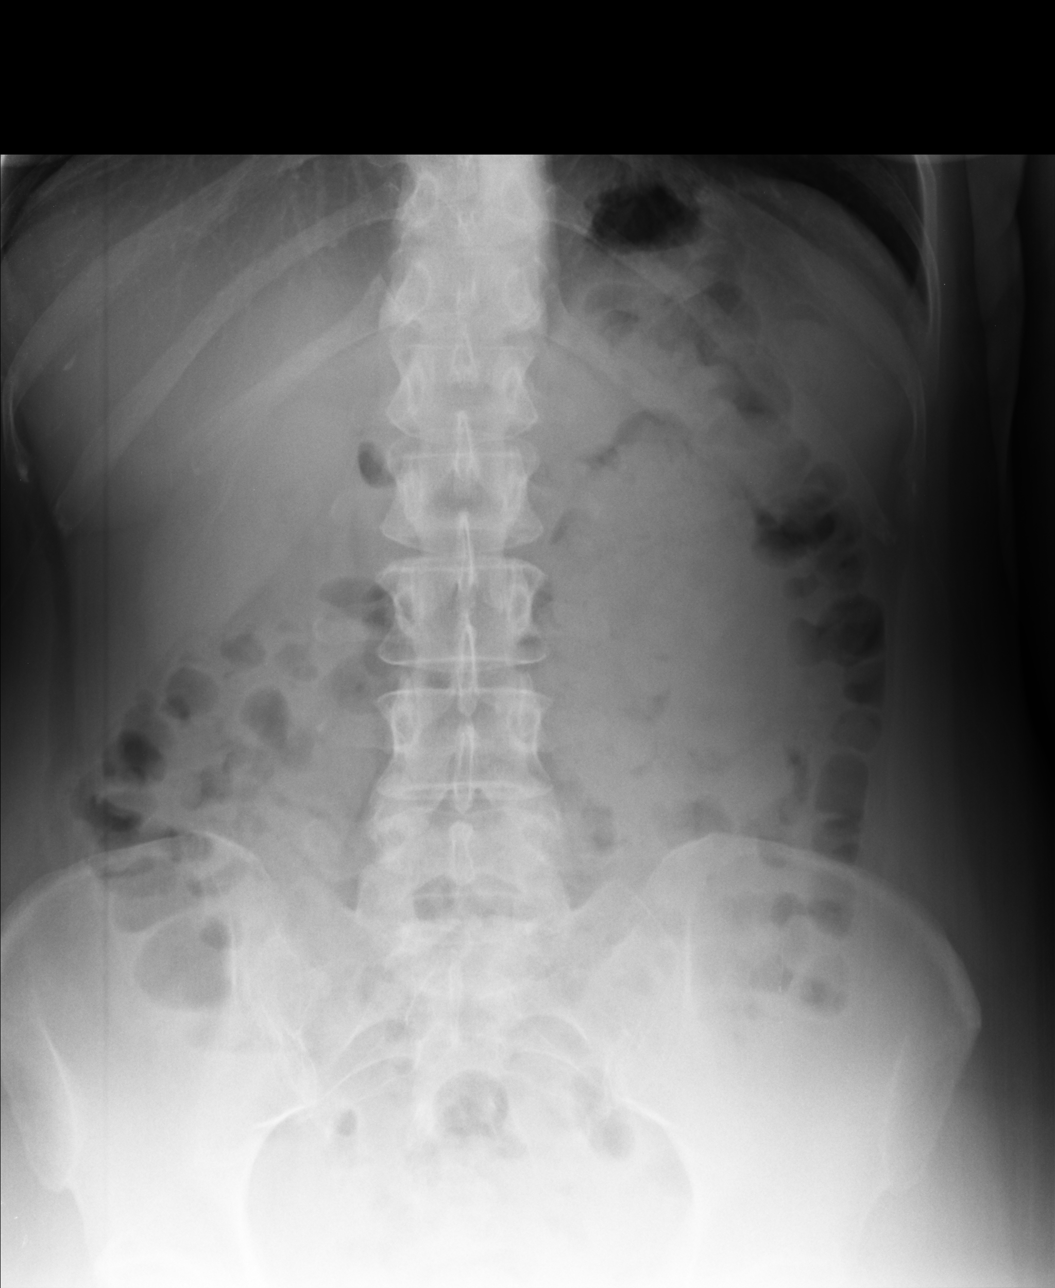

[AP (2 of 2)]
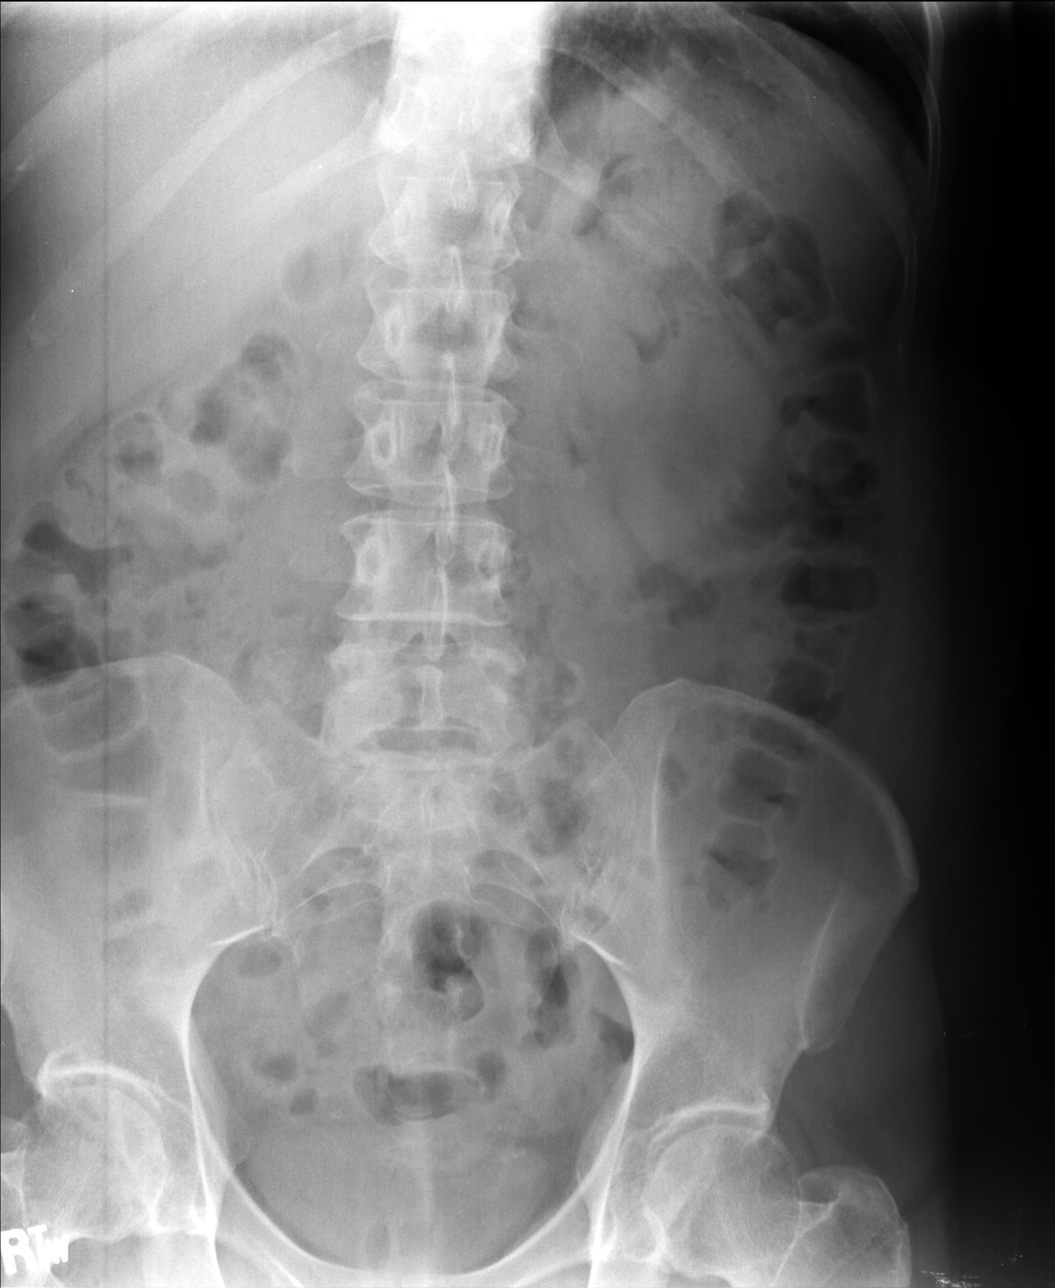

[3 of 3 positions shown; findings below may reference images not displayed]

FINDINGS: Lung volumes are normal. No consolidative airspace disease. No
pleural effusions. No pneumothorax. No pulmonary nodule or mass
noted. Pulmonary vasculature and the cardiomediastinal silhouette
are within normal limits.

Gas and stool are seen scattered throughout the colon extending to
the level of the distal rectum. No pathologic distension of small
bowel is noted. No gross evidence of pneumoperitoneum. Numerous
gas-filled colonic diverticulae are noted throughout the descending
colon.
IMPRESSION: 1.  Nonobstructive bowel gas pattern.
2. No pneumoperitoneum.
3. No radiographic evidence of acute cardiopulmonary disease.
4. Colonic diverticulosis.

## 2017-03-15 IMAGING — US US PELVIS COMPLETE
1 series · 14 of 25 positions shown · non-contrast
Comparison: None

CLINICAL DATA: Postmenopausal pelvic pain.

EXAM:
TRANSABDOMINAL AND TRANSVAGINAL ULTRASOUND OF PELVIS
TECHNIQUE: Both transabdominal and transvaginal ultrasound examinations of the
pelvis were performed. Transabdominal technique was performed for
global imaging of the pelvis including uterus, ovaries, adnexal
regions, and pelvic cul-de-sac. It was necessary to proceed with
endovaginal exam following the transabdominal exam to visualize the
uterus, ovaries, and adnexa.

[Series 1: us pelvis complete · 0.33mm/px · 14 of 35 slices shown]
[im 1/35]
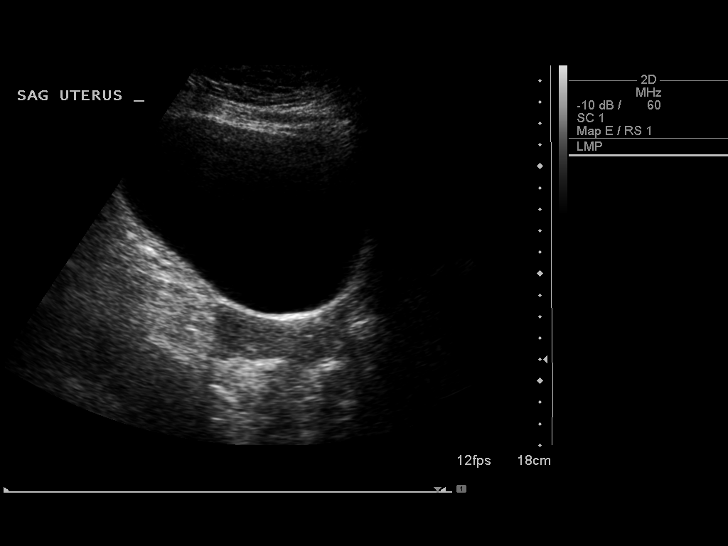
[im 3/35]
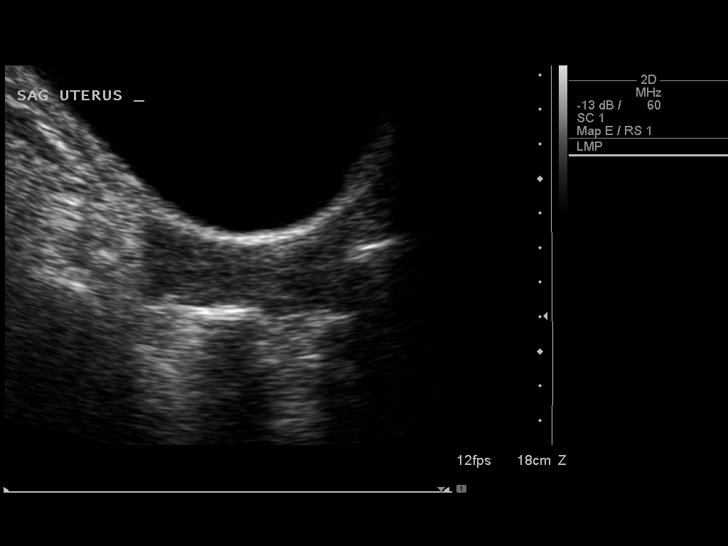
[im 6/35]
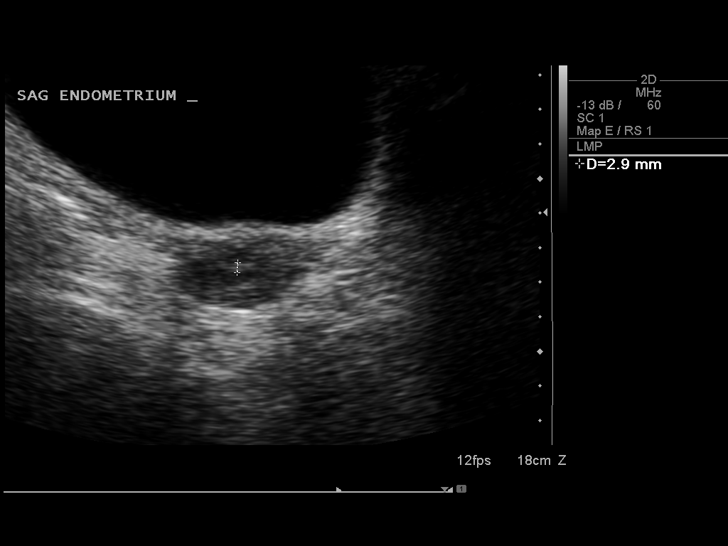
[im 9/35]
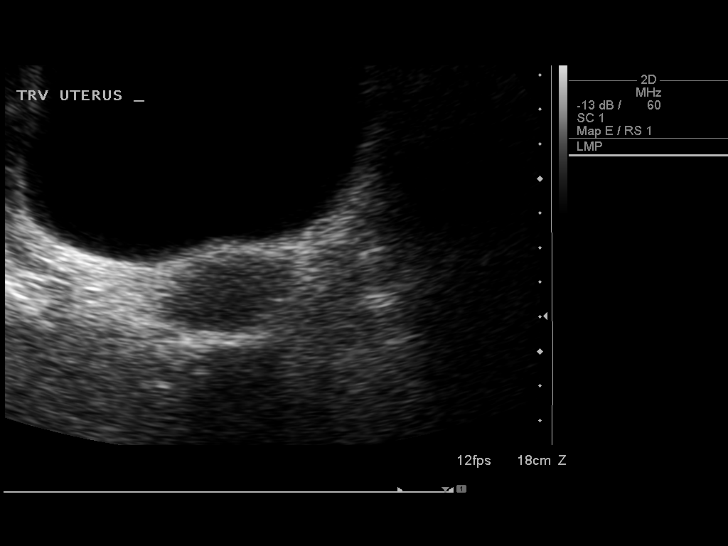
[im 12/35]
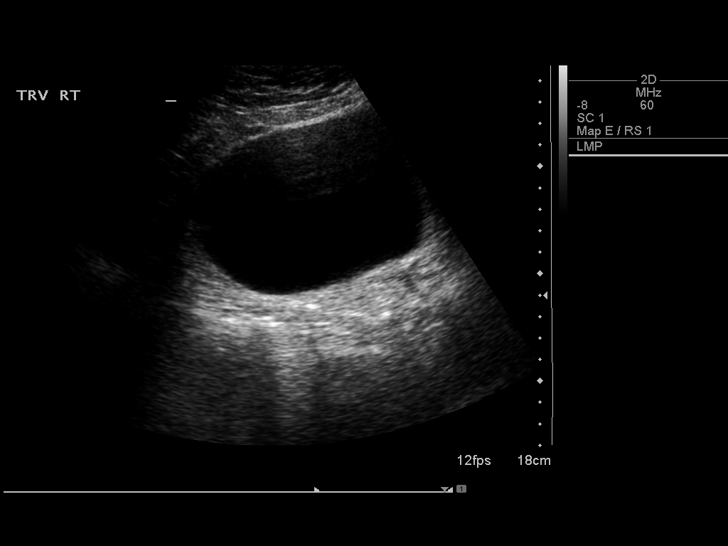
[im 13/35]
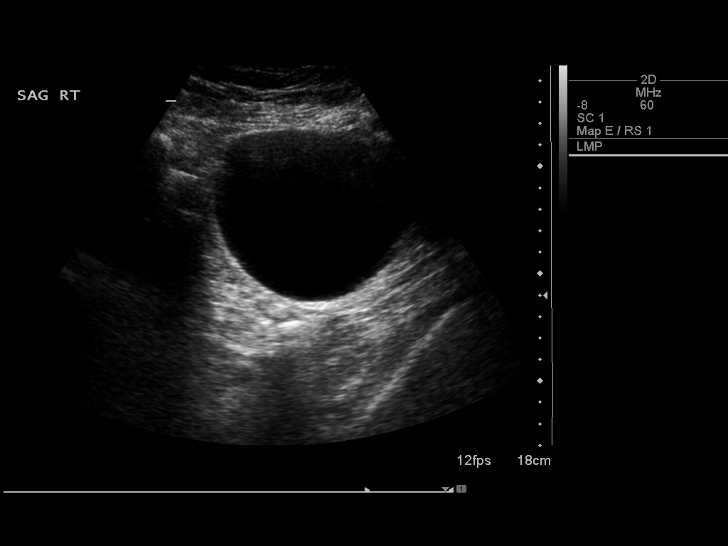
[im 16/35]
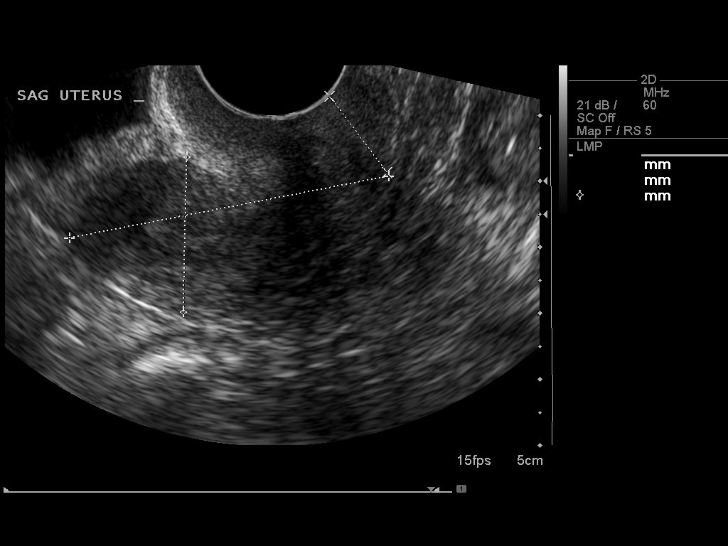
[im 19/35]
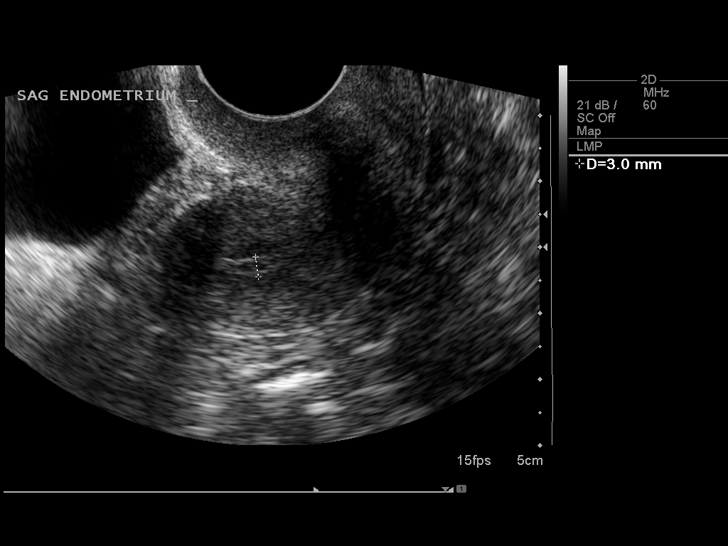
[im 22/35]
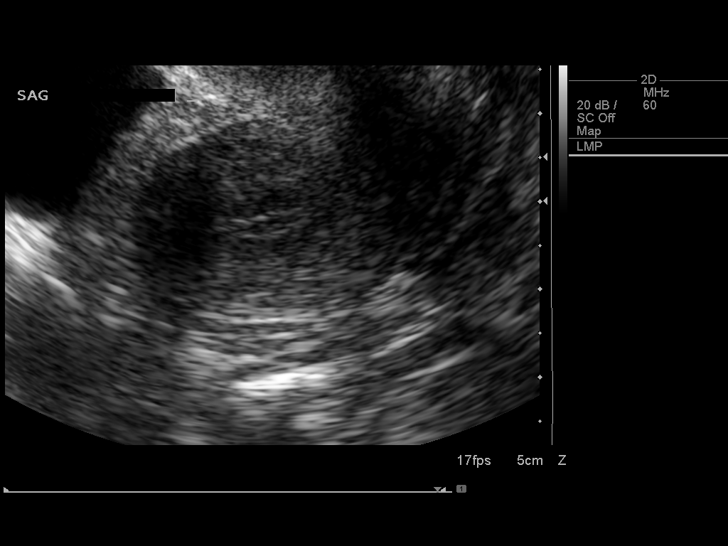
[im 23/35]
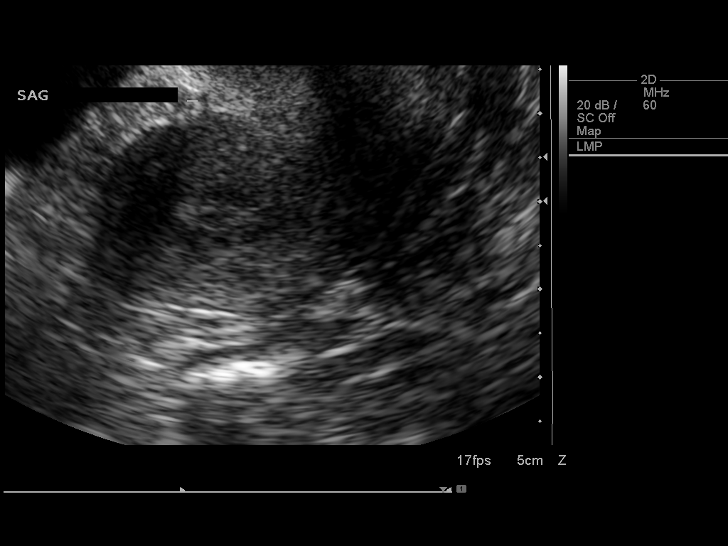
[im 26/35]
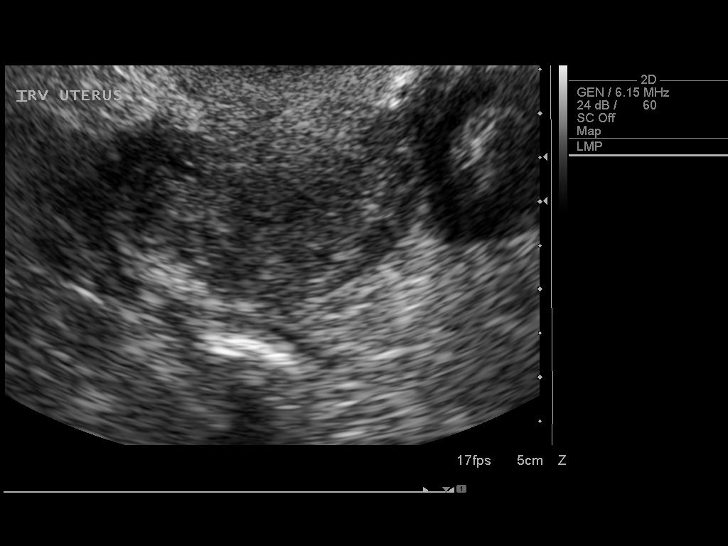
[im 29/35]
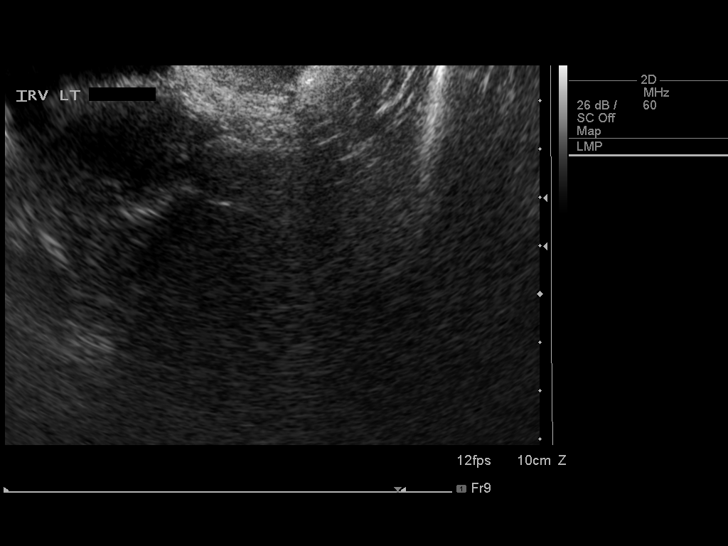
[im 32/35]
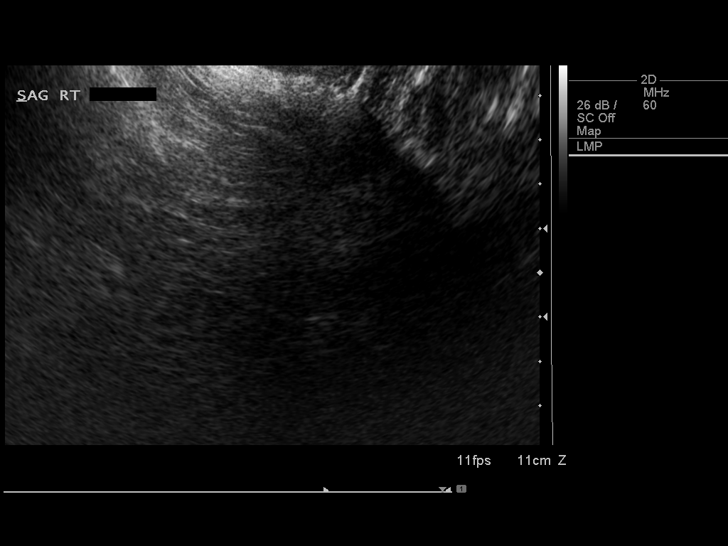
[im 35/35]
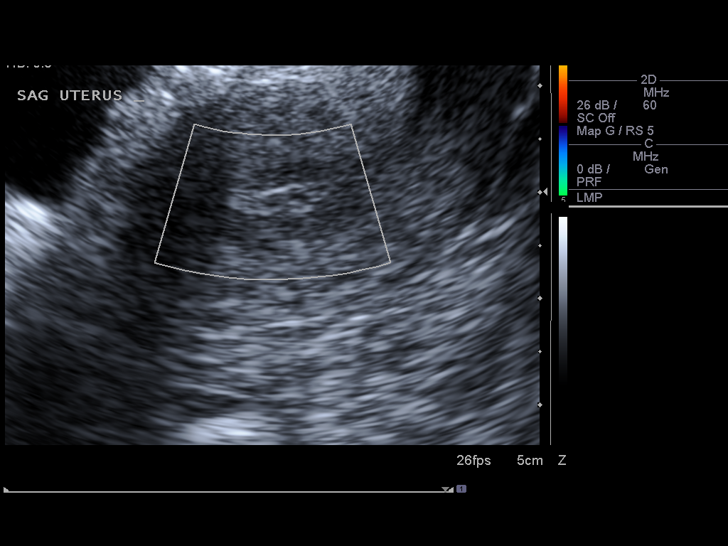

[14 of 25 positions shown; findings below may reference images not displayed]

FINDINGS: Uterus

Measurements: 6.3 x 2.4 x 3.7 cm. Normal in morphology.

Endometrium

Thickness: Normal, 3 mm.

Right ovary

Not visualized.  No adnexal mass.

Left ovary

Not visualized.  No adnexal mass.

Other findings

No free fluid.
IMPRESSION: 1. No explanation for pelvic pain.
2. Lack of visualization of the ovaries.  No adnexal mass seen.
